# Patient Record
Sex: Female | Born: 1995 | Race: Black or African American | Hispanic: No | Marital: Single
Health system: Midwestern US, Community
[De-identification: ages and names within clinical notes are randomized; demographics above are authoritative.]

## PROBLEM LIST (undated history)

## (undated) DIAGNOSIS — E0590 Thyrotoxicosis, unspecified without thyrotoxic crisis or storm: Secondary | ICD-10-CM

## (undated) DIAGNOSIS — E0500 Thyrotoxicosis with diffuse goiter without thyrotoxic crisis or storm: Secondary | ICD-10-CM

## (undated) DIAGNOSIS — J45909 Unspecified asthma, uncomplicated: Secondary | ICD-10-CM

## (undated) DIAGNOSIS — E079 Disorder of thyroid, unspecified: Secondary | ICD-10-CM

## (undated) HISTORY — PX: WISDOM TOOTH EXTRACTION: SHX21

## (undated) HISTORY — PX: TONSILLECTOMY: SUR1361

---
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
## 2014-01-09 LAB — TSH 3RD GENERATION: TSH: 0.411 u[IU]/mL — ABNORMAL LOW (ref 0.450–4.500)

## 2014-01-09 LAB — T4, FREE: T4, Free: 1.02 ng/dL (ref 0.93–1.60)

## 2014-01-15 ENCOUNTER — Encounter

## 2014-01-15 MED ORDER — METHIMAZOLE 5 MG TAB
5 mg | ORAL_TABLET | Freq: Every day | ORAL | Status: DC
Start: 2014-01-15 — End: 2014-01-28

## 2014-01-15 NOTE — Progress Notes (Signed)
Wt Readings from Last 3 Encounters:   01/15/14 169 lb 3.2 oz (76.749 kg) (92 %*, Z = 1.42)   11/13/13 168 lb (76.204 kg) (92 %*, Z = 1.41)   09/04/13 160 lb (72.576 kg) (89 %*, Z = 1.23)     * Growth percentiles are based on CDC 2-20 Years data.     Temp Readings from Last 3 Encounters:   01/15/14 96 ??F (35.6 ??C)      BP Readings from Last 3 Encounters:   01/15/14 115/71   11/13/13 118/73   09/04/13 125/74     Pulse Readings from Last 3 Encounters:   01/15/14 65   11/13/13 64   09/04/13 83

## 2014-01-15 NOTE — Progress Notes (Signed)
[facility] CARE DIABETES AND ENDOCRINOLOGY               Dennison Mascot ,MD        [address] [place] [address] [phone] Fax [phone] [place] [address] [phone] Fax [phone] ( Monday)          Patient Information  Date:01/16/2014  Name : Kim Henderson 18 y.o.     D.O.B : March 19, 1996         Referred by: Arlana Hove, MD         Chief Complaint   Patient presents with   ??? Thyroid Problem       History of present illness    Kim Henderson is a 18 y.o. female  here for follow up of thyroid.    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        She is on hormonal birth control    A paternal aunt has thyroid disease.  No history of radiation exposure.  No FH of thyroid cancer     Wt Readings from Last 3 Encounters:   01/15/14 169 lb 3.2 oz (76.749 kg) (92 %*, Z = 1.42)   11/13/13 168 lb (76.204 kg) (92 %*, Z = 1.41)   09/04/13 160 lb (72.576 kg) (89 %*, Z = 1.23)     * Growth percentiles are based on CDC 2-20 Years data.       Past Medical History   Diagnosis Date   ??? Secondary amenorrhea    ??? Graves disease      Methimazole 10/14       Current Outpatient Prescriptions   Medication Sig   ??? GUAIFEN/PHENYLEPH/ACETAMINOPHN (MUCINEX COLD & SINUS PO) Take 2 Tabs by mouth daily.   ??? fluticasone (FLONASE) 50 mcg/actuation nasal spray nightly.   ??? loratadine (CLARITIN) 10 mg tablet Take 10 mg by mouth as needed for Allergies.   ??? BENZOYL PEROXIDE MICROSPHERES (BENZOYL PEROXIDE WASH EX) by Apply Externally route daily.   ??? naproxen (NAPROSYN) 500 mg tablet Take 500 mg by mouth as needed.   ??? ALBUTEROL SULFATE IN Take  by inhalation as needed.   ??? clindamycin (CLEOCIN T) 1 % external solution Apply   to affected area two (2) times a day. use thin film on affected area   ??? ESTRADIOL CYPIONATE (DEPO-ESTRADIOL IM) by IntraMUSCular route.   ??? methimazole (TAPAZOLE) 5 mg tablet Take 1 Tab by mouth daily.     No current facility-administered medications for this visit.         Review of Systems:  -   - Eyes: no blurry vision or double vision  - Cardiovascular: no chest pain, palpitations  - Respiratory: no cough or shortness of breath  - Gastrointestinal: no dysphagia or abdominal pain  - Musculoskeletal: no joint pains or weakness  - Integumentary: no rashes  - Neurological: no numbness, tingling, or headaches  -     Physical Examination:  - Blood pressure 115/71, pulse 65, temperature 96 ??F (35.6 ??C), resp. rate 16, height 5\' 5"  (1.651 m), weight 169 lb 3.2 oz (  76.749 kg), SpO2 99 %.    Body mass index is 28.16 kg/(m^2).  - General: pleasant, no distress, good eye contact  - HEENT: no exopthalmos, no periorbital edema, no scleral/conjunctival injection, EOMI, no lid lag or stare  - Neck: supple, no thyromegaly  - Cardiovascular: regular,  normal S1 and S2, no murmurs  - Respiratory: clear to auscultation bilaterally  - Gastrointestinal: soft, nontender, nondistended, BS +  - Musculoskeletal: no tremors, no edema  - Neurological: alert and oriented   - Psychiatric: normal mood and affect  - Skin - normal turgor    Data Reviewed:       Component Value Date/Time   WBC 6.1 11/13/2013  1:49 PM   HGB 12.7 11/13/2013  1:49 PM   HCT 38.7 11/13/2013  1:49 PM   PLATELET 216 11/13/2013  1:49 PM   MCV 84 11/13/2013  1:49 PM     No results found for this basename: GPT, ALT, SGOT, GGT, GGTP, AP, APIT, APX, CBIL, TBIL, TBILI, [id number]    Component Value Date/Time   TSH 0.411 01/08/2014 12:00 AM   T4, Free 1.02 01/08/2014 12:00 AM          [x]  Reviewed labs    Assessment/Plan:     1. Hyperthyroidism    2. Graves disease      She is doing well on MMI  Methimazole 5 mg in AM and plan to continue till 3/16  TFTS in 3 months        Follow-up Disposition:  Return in about 4 months (around 05/15/2014).    Thank you for allowing me to participate in the care of this patient.    Dennison MascotUma Dasani Crear, MD

## 2014-01-28 ENCOUNTER — Encounter

## 2014-01-28 MED ORDER — METHIMAZOLE 5 MG TAB
5 mg | ORAL_TABLET | Freq: Every day | ORAL | Status: DC
Start: 2014-01-28 — End: 2014-12-01

## 2014-01-28 NOTE — Telephone Encounter (Signed)
Patient was seen for an appointment and the refills had [facility] to [facility]. That pharmacy is too far away for the patient. She would like for those refills to be sent to a new pharmacy: [facility] [address] [place] the dosage had changed as well. Before it was 2 tablets now its only half a tablet. Patient is also scheduled to have labwork a week before her appointment in July she will be home so if those orders can be mailed to [address] [place] [id number]  Patients mother can be reached at [phone]

## 2014-06-01 LAB — T4, FREE: T4, Free: 1.32 ng/dL (ref 0.93–1.60)

## 2014-06-01 LAB — TSH 3RD GENERATION: TSH: 1.16 u[IU]/mL (ref 0.450–4.500)

## 2014-06-01 NOTE — Progress Notes (Signed)
Patient states since she started her medication for hyperthyroidism she has been fatigue and lack of energy.     Wt Readings from Last 3 Encounters:   06/01/14 173 lb 11.2 oz (78.79 kg) (93 %*, Z = 1.49)   01/15/14 169 lb 3.2 oz (76.749 kg) (92 %*, Z = 1.42)   11/13/13 168 lb (76.204 kg) (92 %*, Z = 1.41)     * Growth percentiles are based on CDC 2-20 Years data.     Temp Readings from Last 3 Encounters:   06/01/14 97.6 ??F (36.4 ??C) Oral   01/15/14 96 ??F (35.6 ??C)      BP Readings from Last 3 Encounters:   06/01/14 105/67   01/15/14 115/71   11/13/13 118/73     Pulse Readings from Last 3 Encounters:   06/01/14 72   01/15/14 65   11/13/13 64

## 2014-06-01 NOTE — Progress Notes (Signed)
[facility] [place] [facility]               Dennison MascotUma  Abagail Limb ,MD        [address] [place] [id number] [phone] Fax [phone]( Tue-Fri)        [address] [place] [id number] MW:[phone]:[phone] Fax [phone] ( Monday)          Patient Information  Date:06/01/2014  Name : Kim Henderson 18 y.o.     D.O.B : 1996-01-17         Referred by: Arlana HoveMarie P Huggins, MD (General)         Chief Complaint   Patient presents with   ??? Thyroid Problem       History of present illness    Kim Henderson is a 18 y.o. female  here for follow up of thyroid.    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        She is on hormonal birth control    A paternal aunt has thyroid disease.  No history of radiation exposure.  No FH of thyroid cancer     Wt Readings from Last 3 Encounters:   06/01/14 173 lb 11.2 oz (78.79 kg) (93 %*, Z = 1.49)   01/15/14 169 lb 3.2 oz (76.749 kg) (92 %*, Z = 1.42)   11/13/13 168 lb (76.204 kg) (92 %*, Z = 1.41)     * Growth percentiles are based on CDC 2-20 Years data.       Past Medical History   Diagnosis Date   ??? Secondary amenorrhea    ??? Graves disease      Methimazole 10/14       Current Outpatient Prescriptions   Medication Sig   ??? methimazole (TAPAZOLE) 5 mg tablet Take 1 Tab by mouth daily.   ??? loratadine (CLARITIN) 10 mg tablet Take 10 mg by mouth as needed for Allergies.   ??? BENZOYL PEROXIDE MICROSPHERES (BENZOYL PEROXIDE WASH EX) by Apply Externally route daily.   ??? naproxen (NAPROSYN) 500 mg tablet Take 500 mg by mouth as needed.   ??? clindamycin (CLEOCIN T) 1 % external solution Apply  to affected area two (2) times a day. use thin film on affected area    ??? ESTRADIOL CYPIONATE (DEPO-ESTRADIOL IM) by IntraMUSCular route.   ??? GUAIFEN/PHENYLEPH/ACETAMINOPHN (MUCINEX COLD & SINUS PO) Take 2 Tabs by mouth daily.   ??? fluticasone (FLONASE) 50 mcg/actuation nasal spray nightly.   ??? ALBUTEROL SULFATE IN Take  by inhalation as needed.     No current facility-administered medications for this visit.         Review of Systems:  -   - Eyes: no blurry vision or double vision  - Cardiovascular: no chest pain, palpitations  - Respiratory: no cough or shortness of breath  - Gastrointestinal: no dysphagia or abdominal pain  - Musculoskeletal: no joint pains + weakness  - Integumentary: no rashes  - Neurological: no numbness, tingling, or headaches  -     Physical Examination:  - Blood pressure 105/67, pulse 72, temperature 97.6 ??F (36.4 ??C),  temperature source Oral, resp. rate 14, height 5\' 5"  (1.651 m), weight 173 lb 11.2 oz (78.79 kg), SpO2 98 %.    Body mass index is 28.91 kg/(m^2).  - General: pleasant, no distress, good eye contact  - HEENT: no exopthalmos, no periorbital edema, no scleral/conjunctival injection, EOMI, no lid lag or stare  - Neck: supple, no thyromegaly  - Cardiovascular: regular,  normal S1 and S2, no murmurs  - Respiratory: clear to auscultation bilaterally  - Gastrointestinal: soft, nontender, nondistended, BS +  - Musculoskeletal: no tremors, no edema  - Neurological: alert and oriented   - Psychiatric: normal mood and affect  - Skin - normal turgor    Data Reviewed:     Lab Results   Component Value Date/Time    WBC 6.1 11/13/2013 01:49 PM    HGB 12.7 11/13/2013 01:49 PM    HCT 38.7 11/13/2013 01:49 PM    PLATELET 216 11/13/2013 01:49 PM    MCV 84 11/13/2013 01:49 PM     No results found for: GPT  Lab Results   Component Value Date/Time    TSH 0.411 01/08/2014 12:00 AM    T4, FREE 1.02 01/08/2014 12:00 AM           Reviewed labs    Assessment/Plan:     1. Graves disease      She is doing well on MMI  Methimazole 5 mg in AM and plan to continue till 3/16   TFTS in 3 months and return in 6 months    2 Fatigue - avoid naps during day time, decrease caffiene      Follow-up Disposition:  Return in about 6 months (around 12/02/2014).    Thank you for allowing me to participate in the care of this patient.    Dennison MascotUma Abdulahad Mederos, MD

## 2014-09-19 ENCOUNTER — Emergency Department (HOSPITAL_COMMUNITY)
Admission: EM | Admit: 2014-09-19 | Discharge: 2014-09-19 | Disposition: A | Discharge status: Home / Self Care | Payer: BC Managed Care – PPO | Attending: Emergency Medicine | Admitting: Emergency Medicine

## 2014-09-19 ENCOUNTER — Emergency Department (HOSPITAL_COMMUNITY): Payer: BC Managed Care – PPO

## 2014-09-19 ENCOUNTER — Encounter (HOSPITAL_COMMUNITY): Payer: Self-pay | Admitting: Emergency Medicine

## 2014-09-19 DIAGNOSIS — Z8639 Personal history of other endocrine, nutritional and metabolic disease: Secondary | ICD-10-CM | POA: Insufficient documentation

## 2014-09-19 DIAGNOSIS — Z79899 Other long term (current) drug therapy: Secondary | ICD-10-CM | POA: Diagnosis not present

## 2014-09-19 DIAGNOSIS — S3992XA Unspecified injury of lower back, initial encounter: Secondary | ICD-10-CM | POA: Diagnosis not present

## 2014-09-19 DIAGNOSIS — Y9389 Activity, other specified: Secondary | ICD-10-CM | POA: Insufficient documentation

## 2014-09-19 DIAGNOSIS — Y9289 Other specified places as the place of occurrence of the external cause: Secondary | ICD-10-CM | POA: Insufficient documentation

## 2014-09-19 DIAGNOSIS — R52 Pain, unspecified: Secondary | ICD-10-CM

## 2014-09-19 DIAGNOSIS — J45909 Unspecified asthma, uncomplicated: Secondary | ICD-10-CM | POA: Insufficient documentation

## 2014-09-19 HISTORY — DX: Disorder of thyroid, unspecified: E07.9

## 2014-09-19 HISTORY — DX: Unspecified asthma, uncomplicated: J45.909

## 2014-09-19 MED ORDER — CYCLOBENZAPRINE HCL 10 MG PO TABS: 10.0000 mg | ORAL_TABLET | Freq: Two times a day (BID) | ORAL | Status: AC | PRN
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
## 2014-09-19 NOTE — Discharge Instructions (Signed)

## 2014-11-13 LAB — T4, FREE: T4, Free: 1.19 ng/dL (ref 0.93–1.60)

## 2014-11-13 LAB — TSH 3RD GENERATION: TSH: 1.83 u[IU]/mL (ref 0.450–4.500)

## 2014-11-17 ENCOUNTER — Ambulatory Visit
Admit: 2014-11-17 | Discharge: 2014-11-17 | Payer: PRIVATE HEALTH INSURANCE | Attending: "Endocrinology | Primary: Family Medicine

## 2014-11-17 DIAGNOSIS — E0500 Thyrotoxicosis with diffuse goiter without thyrotoxic crisis or storm: Secondary | ICD-10-CM

## 2014-11-17 NOTE — Progress Notes (Signed)
[facility]               Dennison MascotUma  Mansa Willers ,MD        [address] [place] [id number] [phone] Fax [phone]( Tue-Fri)        [address] [place] [id number] [id number]:[phone] Fax [phone] ( Monday)          Patient Information  Date:11/18/2014  Name : Kim SagesRochelle M Henderson 18 y.o.     D.O.B : 06/14/1996         Referred by: Arlana HoveMarie P Huggins, MD         Chief Complaint   Patient presents with   ??? Thyroid Problem       History of present illness    Kim Henderson is a 18 y.o. female  here for follow up of thyroid.    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        She is on hormonal birth control    A paternal aunt has thyroid disease.  No history of radiation exposure.  No FH of thyroid cancer     Wt Readings from Last 3 Encounters:   11/17/14 192 lb (87.091 kg) (97 %*, Z = 1.82)   06/01/14 173 lb 11.2 oz (78.79 kg) (93 %*, Z = 1.49)   01/15/14 169 lb 3.2 oz (76.749 kg) (92 %*, Z = 1.42)     * Growth percentiles are based on CDC 2-20 Years data.       Past Medical History   Diagnosis Date   ??? Secondary amenorrhea    ??? Graves disease      Methimazole 10/14       Current Outpatient Prescriptions   Medication Sig   ??? methimazole (TAPAZOLE) 5 mg tablet Take 1 Tab by mouth daily.   ??? GUAIFEN/PHENYLEPH/ACETAMINOPHN (MUCINEX COLD & SINUS PO) Take 2 Tabs by mouth daily.   ??? fluticasone (FLONASE) 50 mcg/actuation nasal spray nightly.   ??? loratadine (CLARITIN) 10 mg tablet Take 10 mg by mouth as needed for Allergies.   ??? BENZOYL PEROXIDE MICROSPHERES (BENZOYL PEROXIDE WASH EX) by Apply Externally route daily.   ??? naproxen (NAPROSYN) 500 mg tablet Take 500 mg by mouth as needed.   ??? ALBUTEROL SULFATE IN Take  by inhalation as needed.    ??? clindamycin (CLEOCIN T) 1 % external solution Apply  to affected area two (2) times a day. use thin film on affected area   ??? ESTRADIOL CYPIONATE (DEPO-ESTRADIOL IM) by IntraMUSCular route.     No current facility-administered medications for this visit.         Review of Systems:  -   - Eyes: no blurry vision or double vision  - Cardiovascular: no chest pain, palpitations  - Respiratory: no cough or shortness of breath  - Gastrointestinal: no dysphagia or abdominal pain  - Musculoskeletal: no joint pains + weakness  - Integumentary: no rashes  - Neurological: no numbness, tingling, or headaches  -     Physical Examination:  - Blood pressure 142/84, pulse 80, temperature 98.3 ??F (36.8 ??C), resp. rate 18, height 5\' 5"  (1.651 m), weight 192 lb (87.091  kg).    Body mass index is 31.95 kg/(m^2).  - General: pleasant, no distress, good eye contact  - HEENT: no exopthalmos, no periorbital edema, no scleral/conjunctival injection, EOMI, no lid lag or stare  - Neck: supple, no thyromegaly  - Cardiovascular: regular,  normal S1 and S2, no murmurs  - Respiratory: clear to auscultation bilaterally  - Gastrointestinal: soft, nontender, nondistended, BS +  - Musculoskeletal: no tremors, no edema  - Neurological: alert and oriented   - Psychiatric: normal mood and affect  - Skin - normal turgor    Data Reviewed:     Lab Results   Component Value Date/Time    WBC 6.1 11/13/2013 01:49 PM    HGB 12.7 11/13/2013 01:49 PM    HCT 38.7 11/13/2013 01:49 PM    PLATELET 216 11/13/2013 01:49 PM    MCV 84 11/13/2013 01:49 PM     No results found for: GPT  Lab Results   Component Value Date/Time    TSH 1.830 11/12/2014 12:00 AM    T4, FREE 1.19 11/12/2014 12:00 AM           Reviewed labs    Assessment/Plan:     1. Graves disease      She is doing well on MMI  Methimazole 5 mg in AM and plan to continue till 3/16  TFTS in 3 months and return in 6 months          Follow-up Disposition:  Return in about 5 months (around 04/18/2015).     Thank you for allowing me to participate in the care of this patient.    Dennison MascotUma Declyn Delsol, MD

## 2014-11-17 NOTE — Patient Instructions (Signed)
Stop Methimazole at the end of March     Come back in June and labs a week before

## 2014-11-17 NOTE — Progress Notes (Signed)
Wt Readings from Last 3 Encounters:   11/17/14 192 lb (87.091 kg) (97 %*, Z = 1.82)   06/01/14 173 lb 11.2 oz (78.79 kg) (93 %*, Z = 1.49)   01/15/14 169 lb 3.2 oz (76.749 kg) (92 %*, Z = 1.42)     * Growth percentiles are based on CDC 2-20 Years data.     Temp Readings from Last 3 Encounters:   11/17/14 98.3 ??F (36.8 ??C)    06/01/14 97.6 ??F (36.4 ??C) Oral   01/15/14 96 ??F (35.6 ??C)      BP Readings from Last 3 Encounters:   11/17/14 142/84   06/01/14 105/67   01/15/14 115/71     Pulse Readings from Last 3 Encounters:   11/17/14 80   06/01/14 72   01/15/14 65

## 2014-11-30 NOTE — Telephone Encounter (Signed)
[facility]

## 2014-12-01 ENCOUNTER — Encounter

## 2014-12-01 MED ORDER — METHIMAZOLE 5 MG TAB
5 mg | ORAL_TABLET | Freq: Every day | ORAL | Status: DC
Start: 2014-12-01 — End: 2016-05-21

## 2014-12-02 MED ORDER — METHIMAZOLE 5 MG TAB
5 mg | ORAL_TABLET | ORAL | Status: DC
Start: 2014-12-02 — End: 2016-04-20

## 2015-01-20 NOTE — Telephone Encounter (Signed)
Patient called to schedule an appt. I have patient scheduled for May 16th, but she wanted to know if she still needs to take Methimazole until she is seen in May or should she stop it in March like she was told?

## 2015-01-20 NOTE — Telephone Encounter (Signed)
Stop in march

## 2015-01-21 NOTE — Telephone Encounter (Signed)
Informed pt to stop Methimazole in March. Pt stated will finish what she has and will refill. Pt verbalized understanding.

## 2015-02-12 ENCOUNTER — Emergency Department (HOSPITAL_COMMUNITY)
Admission: EM | Admit: 2015-02-12 | Discharge: 2015-02-12 | Disposition: A | Discharge status: Home / Self Care | Attending: Emergency Medicine | Admitting: Emergency Medicine

## 2015-02-12 ENCOUNTER — Encounter (HOSPITAL_COMMUNITY): Payer: Self-pay | Admitting: Emergency Medicine

## 2015-02-12 ENCOUNTER — Emergency Department (HOSPITAL_COMMUNITY)

## 2015-02-12 DIAGNOSIS — I10 Essential (primary) hypertension: Secondary | ICD-10-CM | POA: Diagnosis not present

## 2015-02-12 DIAGNOSIS — Z3202 Encounter for pregnancy test, result negative: Secondary | ICD-10-CM | POA: Insufficient documentation

## 2015-02-12 DIAGNOSIS — M5489 Other dorsalgia: Secondary | ICD-10-CM | POA: Diagnosis not present

## 2015-02-12 DIAGNOSIS — R0781 Pleurodynia: Secondary | ICD-10-CM | POA: Insufficient documentation

## 2015-02-12 DIAGNOSIS — M549 Dorsalgia, unspecified: Secondary | ICD-10-CM | POA: Diagnosis present

## 2015-02-12 DIAGNOSIS — Z8639 Personal history of other endocrine, nutritional and metabolic disease: Secondary | ICD-10-CM | POA: Diagnosis not present

## 2015-02-12 DIAGNOSIS — J45901 Unspecified asthma with (acute) exacerbation: Secondary | ICD-10-CM | POA: Insufficient documentation

## 2015-02-12 DIAGNOSIS — Z79899 Other long term (current) drug therapy: Secondary | ICD-10-CM | POA: Insufficient documentation

## 2015-02-12 LAB — URINALYSIS, ROUTINE W REFLEX MICROSCOPIC
Bilirubin Urine: NEGATIVE
Glucose, UA: NEGATIVE mg/dL
Ketones, ur: NEGATIVE mg/dL
LEUKOCYTES UA: NEGATIVE
Nitrite: NEGATIVE
PH: 7 (ref 5.0–8.0)
Protein, ur: NEGATIVE mg/dL
Specific Gravity, Urine: 1.013 (ref 1.005–1.030)
Urobilinogen, UA: 0.2 mg/dL (ref 0.0–1.0)

## 2015-02-12 LAB — COMPREHENSIVE METABOLIC PANEL
ALT: 14 U/L (ref 0–35)
AST: 21 U/L (ref 0–37)
Albumin: 4.2 g/dL (ref 3.5–5.2)
Alkaline Phosphatase: 80 U/L (ref 39–117)
Anion gap: 10 (ref 5–15)
BUN: 9 mg/dL (ref 6–23)
CO2: 23 mmol/L (ref 19–32)
Calcium: 9.6 mg/dL (ref 8.4–10.5)
Chloride: 102 mmol/L (ref 96–112)
Creatinine, Ser: 0.7 mg/dL (ref 0.50–1.10)
GFR calc Af Amer: >90 mL/min (ref >90)
Glucose, Bld: 90 mg/dL (ref 70–99)
Potassium: 3.9 mmol/L (ref 3.5–5.1)
Sodium: 135 mmol/L (ref 135–145)
Total Bilirubin: 0.5 mg/dL (ref 0.3–1.2)
Total Protein: 8.5 g/dL — ABNORMAL HIGH (ref 6.0–8.3)

## 2015-02-12 LAB — D-DIMER, QUANTITATIVE (NOT AT ARMC): D DIMER QUANT: 0.3 ug{FEU}/mL (ref 0.00–0.48)

## 2015-02-12 LAB — CBC WITH DIFFERENTIAL/PLATELET
BASOS PCT: 1 % (ref 0–1)
Basophils Absolute: 0 10*3/uL (ref 0.0–0.1)
EOS ABS: 0.4 10*3/uL (ref 0.0–0.7)
Eosinophils Relative: 5 % (ref 0–5)
HEMATOCRIT: 39.5 % (ref 36.0–46.0)
Hemoglobin: 13.3 g/dL (ref 12.0–15.0)
Lymphocytes Relative: 32 % (ref 12–46)
Lymphs Abs: 2.1 10*3/uL (ref 0.7–4.0)
MCH: 29.1 pg (ref 26.0–34.0)
MCHC: 33.7 g/dL (ref 30.0–36.0)
MCV: 86.4 fL (ref 78.0–100.0)
Monocytes Absolute: 0.6 10*3/uL (ref 0.1–1.0)
Monocytes Relative: 10 % (ref 3–12)
NEUTROS ABS: 3.3 10*3/uL (ref 1.7–7.7)
Neutrophils Relative %: 52 % (ref 43–77)
Platelets: 266 10*3/uL (ref 150–400)
RBC: 4.57 MIL/uL (ref 3.87–5.11)
RDW: 12.3 % (ref 11.5–15.5)
WBC: 6.5 10*3/uL (ref 4.0–10.5)

## 2015-02-12 LAB — PREGNANCY, URINE: PREG TEST UR: NEGATIVE

## 2015-02-12 LAB — I-STAT TROPONIN, ED: TROPONIN I, POC: 0 ng/mL (ref 0.00–0.08)

## 2015-02-12 LAB — URINE MICROSCOPIC-ADD ON

## 2015-02-12 MED ORDER — SODIUM CHLORIDE 0.9 % IV BOLUS (SEPSIS)
1000.0000 mL | Freq: Once | INTRAVENOUS | Status: AC
  Administered 2015-02-12: 1000 mL via INTRAVENOUS

## 2015-02-12 MED ORDER — HYDROCODONE-ACETAMINOPHEN 5-325 MG PO TABS
1.0000 | ORAL_TABLET | Freq: Once | ORAL | Status: AC
  Administered 2015-02-12: 1 via ORAL
  Filled 2015-02-12: qty 1

## 2015-02-12 MED ORDER — HYDROCODONE-ACETAMINOPHEN 5-325 MG PO TABS
1.0000 | ORAL_TABLET | Freq: Four times a day (QID) | ORAL | Status: AC | PRN
Start: 2015-02-12 — End: ?

## 2015-02-12 MED ORDER — TRAMADOL HCL 50 MG PO TABS
50.0000 mg | ORAL_TABLET | Freq: Once | ORAL | Status: AC
  Administered 2015-02-12: 50 mg via ORAL
  Filled 2015-02-12: qty 1
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
## 2015-02-12 NOTE — ED Notes (Addendum)
Pt now reports feeling short of breath, she reports taking birth control and recently driving "back home six hours back and forth", pt has to stop mid sentence to catch her breath when answering questions. .Marland Kitchen

## 2015-02-12 NOTE — Discharge Instructions (Signed)
Continue motrin and flexeril.   Take vicodin for severe pain. Do NOT drive with it.   Follow up with your doctor.   Return to ER if you have severe pain, back pain, weakness.
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
## 2015-04-07 LAB — TSH 3RD GENERATION: TSH: 0.799 u[IU]/mL (ref 0.450–4.500)

## 2015-04-07 LAB — T4, FREE: T4, Free: 1.25 ng/dL (ref 0.82–1.77)

## 2015-04-11 ENCOUNTER — Ambulatory Visit
Admit: 2015-04-11 | Discharge: 2015-04-11 | Payer: PRIVATE HEALTH INSURANCE | Attending: "Endocrinology | Primary: Family Medicine

## 2015-04-11 DIAGNOSIS — E0500 Thyrotoxicosis with diffuse goiter without thyrotoxic crisis or storm: Secondary | ICD-10-CM

## 2015-04-11 NOTE — Progress Notes (Signed)
Order placed for [name],  per Verbal Order with read back from Dr Dellia BeckwithMuthyala 04/11/2015

## 2015-04-11 NOTE — Progress Notes (Signed)
Wt Readings from Last 3 Encounters:   04/11/15 191 lb (86.637 kg)   11/17/14 192 lb (87.091 kg) (97 %*, Z = 1.82)   06/01/14 173 lb 11.2 oz (78.79 kg) (93 %*, Z = 1.49)     * Growth percentiles are based on CDC 2-20 Years data.     Temp Readings from Last 3 Encounters:   04/11/15 95.7 ??F (35.4 ??C) Oral   11/17/14 98.3 ??F (36.8 ??C)    06/01/14 97.6 ??F (36.4 ??C) Oral     BP Readings from Last 3 Encounters:   04/11/15 140/76   11/17/14 142/84   06/01/14 105/67     Pulse Readings from Last 3 Encounters:   04/11/15 60   11/17/14 80   06/01/14 72

## 2015-04-11 NOTE — Progress Notes (Signed)
[facility]               Dennison MascotUma  Jacon Whetzel ,MD        [address] [place] [id number] [phone]:[phone] Fax [phone]( Tue-Fri)        [address] [place] [id number] XL:[phone]:[phone] Fax [phone] ( Monday)          Patient Information  Date:04/12/2015  Name : Kim Henderson Forrey 19 y.o.     D.O.B : 03-08-1996         Referred by: Arlana HoveMarie P Huggins, MD         Chief Complaint   Patient presents with   ??? Thyroid Problem       History of present illness    Kim Henderson Diss is a 19 y.o. female  here for follow up of thyroid.    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        She is on hormonal birth control    A paternal aunt has thyroid disease.  No history of radiation exposure.  No FH of thyroid cancer     Wt Readings from Last 3 Encounters:   04/11/15 191 lb (86.637 kg)   11/17/14 192 lb (87.091 kg) (97 %*, Z = 1.82)   06/01/14 173 lb 11.2 oz (78.79 kg) (93 %*, Z = 1.49)     * Growth percentiles are based on CDC 2-20 Years data.       Past Medical History   Diagnosis Date   ??? Secondary amenorrhea    ??? Graves disease      Methimazole 10/14       Current Outpatient Prescriptions   Medication Sig   ??? fluticasone (FLONASE) 50 mcg/actuation nasal spray nightly.   ??? loratadine (CLARITIN) 10 mg tablet Take 10 mg by mouth as needed for Allergies.   ??? BENZOYL PEROXIDE MICROSPHERES (BENZOYL PEROXIDE WASH EX) by Apply Externally route daily.   ??? naproxen (NAPROSYN) 500 mg tablet Take 500 mg by mouth as needed.   ??? ALBUTEROL SULFATE IN Take  by inhalation as needed.   ??? clindamycin (CLEOCIN T) 1 % external solution Apply  to affected area two (2) times a day. use thin film on affected area    ??? methimazole (TAPAZOLE) 5 mg tablet TAKE 1 TABLET EVERY DAY   ??? methimazole (TAPAZOLE) 5 mg tablet Take 1 Tab by mouth daily.   ??? GUAIFEN/PHENYLEPH/ACETAMINOPHN (MUCINEX COLD & SINUS PO) Take 2 Tabs by mouth daily.   ??? ESTRADIOL CYPIONATE (DEPO-ESTRADIOL IM) by IntraMUSCular route.     No current facility-administered medications for this visit.         Review of Systems:  -   - Eyes: no blurry vision or double vision  - Cardiovascular: no chest pain, palpitations  - Respiratory: no cough or shortness of breath  - Gastrointestinal: no dysphagia or abdominal pain  - Musculoskeletal: no joint pains + weakness  - Integumentary: no rashes  - Neurological: no numbness, tingling, or headaches  -     Physical Examination:  - Blood pressure 130/81, pulse 70, temperature 95.7 ??F (35.4 ??C), temperature  source Oral, height 5\' 5"  (1.651 Henderson), weight 191 lb (86.637 kg), SpO2 100 %.    Body mass index is 31.78 kg/(Henderson^2).  - General: pleasant, no distress, good eye contact  - HEENT: no exopthalmos, no periorbital edema, no scleral/conjunctival injection, EOMI, no lid lag or stare - left mild ptosis  - Neck: supple, no thyromegaly  - Cardiovascular: regular,  normal S1 and S2, no murmurs  - Respiratory: clear to auscultation bilaterally  - Gastrointestinal: soft, nontender, nondistended, BS +  - Musculoskeletal: no tremors, no edema  - Neurological: alert and oriented   - Psychiatric: normal mood and affect  - Skin - normal turgor    Data Reviewed:     Lab Results   Component Value Date/Time    WBC 6.1 11/13/2013 01:49 PM    HGB 12.7 11/13/2013 01:49 PM    HCT 38.7 11/13/2013 01:49 PM    PLATELET 216 11/13/2013 01:49 PM    MCV 84 11/13/2013 01:49 PM     No results found for: GPT  Lab Results   Component Value Date/Time    TSH 0.799 04/06/2015 12:05 PM    T4, FREE 1.25 04/06/2015 12:05 PM           Reviewed labs    Assessment/Plan:     1. Graves disease    2. 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    Thank you for allowing me to participate in the care of this patient.    Dennison MascotUma Wyoma Genson, MD
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
## 2016-04-20 ENCOUNTER — Ambulatory Visit
Admit: 2016-04-20 | Discharge: 2016-04-20 | Payer: PRIVATE HEALTH INSURANCE | Attending: "Endocrinology | Primary: Family Medicine

## 2016-04-20 DIAGNOSIS — E0500 Thyrotoxicosis with diffuse goiter without thyrotoxic crisis or storm: Secondary | ICD-10-CM

## 2016-04-20 NOTE — Progress Notes (Signed)
Order placed for pt,  per Verbal Order with read back from Dr Muthyala 04/20/2016

## 2016-04-20 NOTE — Patient Instructions (Addendum)
Radioactive Iodine: What to Expect at Home  Your Recovery  Radioactive iodine is absorbed and concentrated by the thyroid gland. You get it in liquid or pill form. The radiation will pass out of your body through your urine within days. Until that time, you will give off radiation in your sweat, your saliva, your urine, and anything else that comes out of your body. It is important to avoid exposing other people to the radioactivity from your body.  Your doctor will give you more written instructions. Follow these carefully. The instructions will tell you how far to stay away from people and how long you need to follow the precautions listed below. They will list other ways to keep other people safe. They will also tell you when it will be safe to go out, go to work, and do other activities.  How can you care for yourself at home?  General recommendations  ?? For a period of time, you will need to keep your distance from other people, especially young children and pregnant women.  ?? Avoid close contact, kissing, and sexual activity. You may need to sleep in a separate bed from your partner.  ?? Keep the toilet very clean. Men should urinate sitting down to avoid splashing. Flush the toilet 2 or 3 times after each use. Wash your hands well with soap and lots of water each time you use the toilet.  ?? Rinse the bathroom sink and tub well after you use them.  ?? Use separate towels, washcloths, and sheets. Wash these and your personal clothing by themselves. Don't wash them with other people's laundry.  ?? You may want to use a special plastic trash bag for all your trash, such as bandages, paper or plastic dishes, menstrual pads, tissues, or paper towels. Talk to your treatment facility to see if they will handle the disposal. Or after 80 days, this bag can be thrown out with your other trash.  ?? Wash your dishes in a dishwasher or by hand. If you use disposable  dishes, they must be thrown away in the special plastic trash bag.  ?? Don't cook for other people. If you must cook, use plastic gloves. Then throw them away in the special plastic trash bag. Don't share cups, dishes, or utensils.  Pregnancy and children  ?? Your doctor will tell you when it is safe to have sex and become pregnant.  ?? You should not breastfeed your baby after you have been treated with radioactive iodine. Ask your doctor when it's safe to breastfeed.  Travel  ?? Don't take public transportation. If you are able, it's best to drive yourself.  ?? It is important to prepare for any problems you may have at airport security. People who have had radioactive iodine treatment can set off the radiation detection machines in airports for a week to 10 days. Check with local authorities about any steps or permission you may need to travel.  ?? If you plan to travel on the interstate, you may set off radiation detectors. Most police and transportation workers are aware of medical radiation, but it may help to carry some paperwork from your doctor.  Follow-up care is a key part of your treatment and safety. Be sure to make and go to all appointments, and call your doctor if you are having problems. It's also a good idea to know your test results and keep a list of the medicines you take.  When should you call for help?    Watch closely for any changes in your health, and be sure to contact your doctor if:  ?? You have a sore throat.  ?? You vomit.  ?? You have diarrhea.  Where can you learn more?  Go to InsuranceStats.cahttp://www.healthwise.net/GoodHelpConnections.  Enter (614)143-8426721 in the search box to learn more about "Radioactive Iodine: What to Expect at Home."  Current as of: June 23, 2015  Content Version: 11.2  ?? 2006-2017 [employer]wise[employer]. Care instructions adapted under license by [employer] (which disclaims liability or warranty for this information). If you have questions about a medical condition or  this instruction, always ask your healthcare professional. Healthwise, Incorporated disclaims any warranty or liability for your use of this information.

## 2016-04-20 NOTE — Progress Notes (Signed)
[name] is a 20 y.o. female here for   Chief Complaint   Patient presents with   ??? Thyroid Problem       Wt Readings from Last 3 Encounters:   04/11/15 191 lb (86.6 kg)   11/17/14 192 lb (87.1 kg) (97 %, Z= 1.82)*   06/01/14 173 lb 11.2 oz (78.8 kg) (93 %, Z= 1.49)*     * Growth percentiles are based on CDC 2-20 Years data.     Temp Readings from Last 3 Encounters:   04/11/15 95.7 ??F (35.4 ??C) (Oral)   11/17/14 98.3 ??F (36.8 ??C)   06/01/14 97.6 ??F (36.4 ??C) (Oral)     BP Readings from Last 3 Encounters:   04/11/15 130/81   11/17/14 142/84   06/01/14 105/67     Pulse Readings from Last 3 Encounters:   04/11/15 70   11/17/14 80   06/01/14 72

## 2016-04-20 NOTE — Progress Notes (Signed)
[facility] CARE DIABETES AND ENDOCRINOLOGY               Dennison Mascot ,MD        [address] [place] [address] [phone] Fax [phone]           Patient Information  Date:04/23/2016  Name : Kim Henderson 20 y.o.     D.O.B : 1996/11/15         Referred by: Arlana Hove, MD         Chief Complaint   Patient presents with   ??? Thyroid Problem       History of present illness    Kim Henderson is a 20 y.o. female  here for follow up of thyroid.    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    A paternal aunt has thyroid disease.  No history of radiation exposure.  No FH of thyroid cancer     Wt Readings from Last 3 Encounters:   04/20/16 195 lb (88.5 kg)   04/11/15 191 lb (86.6 kg)   11/17/14 192 lb (87.1 kg) (97 %, Z= 1.82)*     * Growth percentiles are based on CDC 2-20 Years data.       Past Medical History:   Diagnosis Date   ??? Graves disease     Methimazole 10/14   ??? Secondary amenorrhea        Current Outpatient Prescriptions   Medication Sig   ??? SETLAKIN 0.15 mg-30 mcg    ??? GUAIFEN/PHENYLEPH/ACETAMINOPHN (MUCINEX COLD & SINUS PO) Take 2 Tabs by mouth daily.   ??? fluticasone (FLONASE) 50 mcg/actuation nasal spray 2 Sprays by Both Nostrils route as needed.   ??? loratadine (CLARITIN) 10 mg tablet Take 10 mg by mouth as needed for Allergies.   ??? BENZOYL PEROXIDE MICROSPHERES (BENZOYL PEROXIDE WASH EX) by Apply Externally route daily.   ??? naproxen (NAPROSYN) 500 mg tablet Take 500 mg by mouth as needed.   ??? ALBUTEROL SULFATE IN Take  by inhalation as needed.   ??? clindamycin (CLEOCIN T) 1 % external solution Apply  to affected area two (2) times a day. use thin film on affected area   ??? methimazole (TAPAZOLE) 5 mg tablet Take 1 Tab by mouth daily.      No current facility-administered medications for this visit.          Review of Systems:  -   - Eyes: no blurry vision or double vision  - Cardiovascular: no chest pain, palpitations  - Respiratory: no cough or shortness of breath  - Gastrointestinal: no dysphagia or abdominal pain  - Musculoskeletal: no joint pains , weakness  - Integumentary: no rashes  - Neurological: no numbness, tingling, or headaches  -     Physical Examination:  - Blood pressure 148/77, pulse 94, temperature 97.8 ??F (36.6 ??C), temperature source Oral, resp. rate 16, height 5\' 5"  (1.651 m), weight 195 lb (88.5 kg).    Body mass index is 32.45 kg/(m^2).  - General: pleasant, no distress, good eye contact  - HEENT: no exopthalmos, no periorbital edema, no scleral/conjunctival injection, EOMI, no lid lag or stare - left mild ptosis  - Neck: supple, no  thyromegaly  - Cardiovascular: regular,  normal S1 and S2, no murmurs  - Respiratory: clear to auscultation bilaterally  - Gastrointestinal: soft, nontender, nondistended, BS +  - Musculoskeletal: no tremors, no edema  - Neurological: alert and oriented   - Psychiatric: normal mood and affect  - Skin - normal turgor    Data Reviewed:     Lab Results   Component Value Date/Time    WBC 6.1 11/13/2013 01:49 PM    HGB 12.7 11/13/2013 01:49 PM    HCT 38.7 11/13/2013 01:49 PM    PLATELET 216 11/13/2013 01:49 PM    MCV 84 11/13/2013 01:49 PM     No results found for: GPT  Lab Results   Component Value Date/Time    TSH 0.799 04/06/2015 12:05 PM    T4, Free 1.25 04/06/2015 12:05 PM           Reviewed labs    Assessment/Plan:     1. Graves disease    2. Hyperthyroidism    3. 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    Thank you for allowing me to participate in the care of this patient.    Dennison MascotUma Kregg Cihlar, MD

## 2016-04-21 LAB — HCG QL SERUM: hCG,Beta Subunit,Ql.: NEGATIVE m[IU]/mL (ref <6)

## 2016-04-25 ENCOUNTER — Encounter

## 2016-05-01 ENCOUNTER — Inpatient Hospital Stay: Payer: TRICARE (CHAMPUS) | Attending: "Endocrinology | Primary: Family Medicine

## 2016-05-02 ENCOUNTER — Ambulatory Visit: Payer: TRICARE (CHAMPUS) | Primary: Family Medicine

## 2016-05-07 ENCOUNTER — Inpatient Hospital Stay: Attending: "Endocrinology | Primary: Family Medicine

## 2016-05-18 ENCOUNTER — Encounter

## 2016-05-18 NOTE — Telephone Encounter (Signed)
Patient has decided not to do Iodine treatment.  Patient says she has to be around people and now is not a good time for treatment. Patient would like go back on regular medication and wants prescription sent to [facility].

## 2016-05-18 NOTE — Telephone Encounter (Signed)
Methimazole 20 mg in AM     FU in 2 months , Labs TSH, FT4

## 2016-05-21 MED ORDER — METHIMAZOLE 10 MG TAB
10 mg | ORAL_TABLET | Freq: Every day | ORAL | 5 refills | Status: AC
Start: 2016-05-21 — End: ?

## 2016-05-21 NOTE — Telephone Encounter (Signed)
Informed pt that Methimazole 20 mg in the AM will be sent to pharmacy. Pt states she will call back to schedule 2 month follow up with labs [facility]. Order placed for pt,  per Verbal Order with read back from Dr Dellia BeckwithMuthyala 05/21/2016

## 2016-05-30 ENCOUNTER — Encounter: Primary: Family Medicine

## 2016-06-05 ENCOUNTER — Encounter: Attending: "Endocrinology | Primary: Family Medicine

## 2016-06-21 ENCOUNTER — Encounter: Primary: Family Medicine

## 2016-06-28 ENCOUNTER — Encounter: Attending: "Endocrinology | Primary: Family Medicine

## 2016-07-18 NOTE — Telephone Encounter (Signed)
Patient would like a call once Dr. Dellia BeckwithMuthyala views us report from [facility].

## 2016-07-23 NOTE — Telephone Encounter (Signed)
-----   Message from Vella Raring sent at 07/23/2016  7:48 AM EDT -----  Regarding: Dr.Muthyala/telephone  Pt would like for Dr.Muthyala to review her ultrasound results and contact her to discuss the results. Pt can be reached at [phone].

## 2016-08-02 NOTE — Telephone Encounter (Signed)
Informed pt that Dr Dellia Beckwith reviewed thyroid US and it was consistent with Graves disease and there was no need for [place] actually. Pt verbalized understanding

## 2016-08-15 ENCOUNTER — Encounter: Attending: "Endocrinology | Primary: Family Medicine
